# Patient Record
Sex: Female | Born: 2000 | Race: White | Hispanic: No | Marital: Single | Smoking: Never smoker
Health system: Southern US, Community
[De-identification: ages and names within clinical notes are randomized; demographics above are authoritative.]

## PROBLEM LIST (undated history)

## (undated) DIAGNOSIS — F419 Anxiety disorder, unspecified: Secondary | ICD-10-CM

## (undated) DIAGNOSIS — F329 Major depressive disorder, single episode, unspecified: Secondary | ICD-10-CM

## (undated) DIAGNOSIS — G43909 Migraine, unspecified, not intractable, without status migrainosus: Secondary | ICD-10-CM

## (undated) DIAGNOSIS — Q796 Ehlers-Danlos syndrome, unspecified: Secondary | ICD-10-CM

## (undated) DIAGNOSIS — G90A Postural orthostatic tachycardia syndrome (POTS): Secondary | ICD-10-CM

## (undated) DIAGNOSIS — N390 Urinary tract infection, site not specified: Secondary | ICD-10-CM

## (undated) DIAGNOSIS — F32A Depression, unspecified: Secondary | ICD-10-CM

## (undated) DIAGNOSIS — R569 Unspecified convulsions: Secondary | ICD-10-CM

## (undated) HISTORY — DX: Migraine, unspecified, not intractable, without status migrainosus: G43.909

## (undated) HISTORY — DX: Urinary tract infection, site not specified: N39.0

## (undated) HISTORY — DX: Unspecified convulsions: R56.9

## (undated) HISTORY — DX: Anxiety disorder, unspecified: F41.9

## (undated) HISTORY — DX: Depression, unspecified: F32.A

---
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
## 2019-09-07 ENCOUNTER — Other Ambulatory Visit: Payer: Self-pay

## 2019-09-07 DIAGNOSIS — Z20822 Contact with and (suspected) exposure to covid-19: Secondary | ICD-10-CM

## 2019-09-08 LAB — NOVEL CORONAVIRUS, NAA: SARS-CoV-2, NAA: NOT DETECTED

## 2020-01-25 ENCOUNTER — Ambulatory Visit (INDEPENDENT_AMBULATORY_CARE_PROVIDER_SITE_OTHER): Payer: Managed Care, Other (non HMO) | Admitting: Urology

## 2020-01-25 ENCOUNTER — Encounter: Payer: Self-pay | Admitting: Urology

## 2020-01-25 ENCOUNTER — Other Ambulatory Visit: Payer: Self-pay

## 2020-01-25 VITALS — BP 113/80 | HR 99 | Ht 64.0 in | Wt 141.5 lb

## 2020-01-25 DIAGNOSIS — N390 Urinary tract infection, site not specified: Secondary | ICD-10-CM

## 2020-01-25 NOTE — Progress Notes (Signed)
01/25/20 11:33 AM   Allison Torres 2001/01/14 [id number]  Referring provider: Lorraine Lax, FNP [phone] [place],  [place] [address]  CC: Recurrent UTIs  HPI: I saw Allison Torres in urology clinic today for evaluation of recurrent UTI.  She is a 19 year old female with past medical history notable for anxiety and depression and GERD/IBS who has had 3 culture documented UTIs over the last 6 months in June, August, and last week.  The only urine culture I have access to is from 08/01/2019 with greater than 100K E. coli that was pan susceptible.  She will intermittently have some back pain, worse on the right side, and she is unsure if these are associated with her episodes of UTI.  She has noted some gross hematuria during times of active infection.  Her symptoms of UTI are typically dysuria, pelvic pain, and foul-smelling urine.  She denies any history of urinary retention or nephrolithiasis.  She also had work-up with STD testing recently that was all negative.  She reports she had a normal pelvic exam in the last 3 months.  She has been sexually active with only 1 partner in the last year.  She denies any changes in her medications or health otherwise over the last 6 months.  She feels her UTI in August may have been associated with sexual activity, but does not feel like the UTI in June or December was related.  She denies any incontinence or other urinary symptoms.  She has had trouble in the past with constipation, but feels she has been more regular lately since starting a probiotic.  She denies any symptoms of UTI today.  Urinalysis today is benign with 0-5 WBCs, 0-2 RBCs, few bacteria, nitrite negative.   PMH: Past Medical History:  Diagnosis Date  . Anxiety   . Depression   . Migraine   . Seizure (HCC)   . Urinary tract infection     Surgical History: None   Allergies:  Allergies  Allergen Reactions  . Bupropion     Other reaction(s): Other  (See Comments) Seizure     Family History: No family history on file.  Social History:  reports that she has never smoked. She has never used smokeless tobacco. She reports previous alcohol use. She reports that she does not use drugs.  ROS: Please see flowsheet from today's date for complete review of systems.  Physical Exam: BP 113/80 (BP Location: Left Arm, Patient Position: Sitting, Cuff Size: Normal)   Pulse 99   Ht 5\' 4"  (1.626 m)   Wt 141 lb 8 oz (64.2 kg)   LMP 01/24/2020   BMI 24.29 kg/m    Constitutional:  Alert and oriented, No acute distress. Cardiovascular: No clubbing, cyanosis, or edema. Respiratory: Normal respiratory effort, no increased work of breathing. GI: Abdomen is soft, nontender, nondistended, no abdominal masses GU: Patient deferred Lymph: No cervical or inguinal lymphadenopathy. Skin: No rashes, bruises or suspicious lesions. Neurologic: Grossly intact, no focal deficits, moving all 4 extremities. Psychiatric: Normal mood and affect.  Laboratory Data: Reviewed, see HPI  Pertinent Imaging: None to review  Assessment & Plan:   In summary, she is a 19 year old female with 3 UTIs over the last 6 months.  We discussed the evaluation and treatment of patients with recurrent UTIs at length.  We specifically discussed the differences between asymptomatic bacteriuria and true urinary tract infection.  We discussed the AUA definition of recurrent UTI of at least  2 culture proven symptomatic acute cystitis episodes in a 62-month period, or 3 within a 1 year period.  We discussed the importance of culture directed antibiotic treatment, and antibiotic stewardship.  First-line therapy includes nitrofurantoin(5 days), Bactrim(3 days), or fosfomycin(3 g single dose).  Possible etiologies of recurrent infection include periurethral tissue atrophy in postmenopausal woman, constipation, sexual activity, incomplete emptying, anatomic abnormalities, and even genetic  predisposition.  Finally, we discussed the role of perineal hygiene, timed voiding, adequate hydration, topical vaginal estrogen, cranberry prophylaxis, and low-dose antibiotic prophylaxis.  -Cranberry tablet prophylaxis twice daily -Renal ultrasound to rule out hydronephrosis or stones in the setting of her back pain, will call with results -Virtual visit 8 weeks for symptom check, consider post-coital or low-dose trimethoprim prophylaxis if persistent UTIs  A total of 30 minutes were spent face-to-face with the patient, greater than 50% was spent in patient education, counseling, and coordination of care regarding recurrent UTIs.   Billey Co, [facility] [address], [place], [place] [address] [phone]

## 2020-01-25 NOTE — Patient Instructions (Signed)
1.  Drink plenty of fluids to keep the bladder flushed, try not to hold your urine for long times when you have to urinate 2.  Start cranberry tablets twice daily to help prevent urinary tract infections.  These can be purchased over-the-counter and do not require prescription.   Urinary Tract Infection, Adult  A urinary tract infection (UTI) is an infection of any part of the urinary tract. The urinary tract includes the kidneys, ureters, bladder, and urethra. These organs make, store, and get rid of urine in the body. Your health care provider may use other names to describe the infection. An upper UTI affects the ureters and kidneys (pyelonephritis). A lower UTI affects the bladder (cystitis) and urethra (urethritis). What are the causes? Most urinary tract infections are caused by bacteria in your genital area, around the entrance to your urinary tract (urethra). These bacteria grow and cause inflammation of your urinary tract. What increases the risk? You are more likely to develop this condition if:  You have a urinary catheter that stays in place (indwelling).  You are not able to control when you urinate or have a bowel movement (you have incontinence).  You are female and you: ? Use a spermicide or diaphragm for birth control. ? Have low estrogen levels. ? Are pregnant.  You have certain genes that increase your risk (genetics).  You are sexually active.  You take antibiotic medicines.  You have a condition that causes your flow of urine to slow down, such as: ? An enlarged prostate, if you are female. ? Blockage in your urethra (stricture). ? A kidney stone. ? A nerve condition that affects your bladder control (neurogenic bladder). ? Not getting enough to drink, or not urinating often.  You have certain medical conditions, such as: ? Diabetes. ? A weak disease-fighting system (immunesystem). ? Sickle cell disease. ? Gout. ? Spinal cord injury. What are the signs or  symptoms? Symptoms of this condition include:  Needing to urinate right away (urgently).  Frequent urination or passing small amounts of urine frequently.  Pain or burning with urination.  Blood in the urine.  Urine that smells bad or unusual.  Trouble urinating.  Cloudy urine.  Vaginal discharge, if you are female.  Pain in the abdomen or the lower back. You may also have:  Vomiting or a decreased appetite.  Confusion.  Irritability or tiredness.  A fever.  Diarrhea. The first symptom in older adults may be confusion. In some cases, they may not have any symptoms until the infection has worsened. How is this diagnosed? This condition is diagnosed based on your medical history and a physical exam. You may also have other tests, including:  Urine tests.  Blood tests.  Tests for sexually transmitted infections (STIs). If you have had more than one UTI, a cystoscopy or imaging studies may be done to determine the cause of the infections. How is this treated? Treatment for this condition includes:  Antibiotic medicine.  Over-the-counter medicines to treat discomfort.  Drinking enough water to stay hydrated. If you have frequent infections or have other conditions such as a kidney stone, you may need to see a health care provider who specializes in the urinary tract (urologist). In rare cases, urinary tract infections can cause sepsis. Sepsis is a life-threatening condition that occurs when the body responds to an infection. Sepsis is treated in the hospital with IV antibiotics, fluids, and other medicines. Follow these instructions at home:  Medicines  Take over-the-counter and prescription  medicines only as told by your health care provider.  If you were prescribed an antibiotic medicine, take it as told by your health care provider. Do not stop using the antibiotic even if you start to feel better. General instructions  Make sure you: ? Empty your bladder  often and completely. Do not hold urine for long periods of time. ? Empty your bladder after sex. ? Wipe from front to back after a bowel movement if you are female. Use each tissue one time when you wipe.  Drink enough fluid to keep your urine pale yellow.  Keep all follow-up visits as told by your health care provider. This is important. Contact a health care provider if:  Your symptoms do not get better after 1-2 days.  Your symptoms go away and then return. Get help right away if you have:  Severe pain in your back or your lower abdomen.  A fever.  Nausea or vomiting. Summary  A urinary tract infection (UTI) is an infection of any part of the urinary tract, which includes the kidneys, ureters, bladder, and urethra.  Most urinary tract infections are caused by bacteria in your genital area, around the entrance to your urinary tract (urethra).  Treatment for this condition often includes antibiotic medicines.  If you were prescribed an antibiotic medicine, take it as told by your health care provider. Do not stop using the antibiotic even if you start to feel better.  Keep all follow-up visits as told by your health care provider. This is important. This information is not intended to replace advice given to you by your health care provider. Make sure you discuss any questions you have with your health care provider. Document Revised: 11/24/2018 Document Reviewed: 06/16/2018 Elsevier Patient Education  2020 [employer].

## 2020-01-26 LAB — URINALYSIS, COMPLETE
Bilirubin, UA: NEGATIVE
Glucose, UA: NEGATIVE
Leukocytes,UA: NEGATIVE
Nitrite, UA: NEGATIVE
Specific Gravity, UA: 1.025 (ref 1.005–1.030)
Urobilinogen, Ur: 2 mg/dL — ABNORMAL HIGH (ref 0.2–1.0)
pH, UA: 5.5 (ref 5.0–7.5)

## 2020-01-26 LAB — MICROSCOPIC EXAMINATION

## 2020-02-09 ENCOUNTER — Ambulatory Visit
Admission: RE | Admit: 2020-02-09 | Discharge: 2020-02-09 | Disposition: A | Discharge status: Home / Self Care | Payer: Managed Care, Other (non HMO) | Source: Ambulatory Visit | Attending: Urology | Admitting: Urology

## 2020-02-09 ENCOUNTER — Other Ambulatory Visit: Payer: Self-pay

## 2020-02-09 DIAGNOSIS — N390 Urinary tract infection, site not specified: Secondary | ICD-10-CM | POA: Diagnosis present

## 2020-02-12 ENCOUNTER — Telehealth: Payer: Self-pay

## 2020-02-12 NOTE — Telephone Encounter (Signed)
Called pt no answer unable to leave message as voicemail is full. Mychart message sent.

## 2020-02-12 NOTE — Telephone Encounter (Signed)
-----   Message from Sondra Come, MD sent at 02/12/2020  8:16 AM EST ----- Normal renal US, no stones or hydro, keep follow up as scheduled  Legrand Rams, MD 02/12/2020

## 2020-02-22 ENCOUNTER — Ambulatory Visit: Payer: 59 | Attending: Internal Medicine

## 2020-02-22 DIAGNOSIS — Z20822 Contact with and (suspected) exposure to covid-19: Secondary | ICD-10-CM

## 2020-02-23 LAB — NOVEL CORONAVIRUS, NAA: SARS-CoV-2, NAA: NOT DETECTED

## 2020-03-28 ENCOUNTER — Telehealth: Payer: Self-pay | Admitting: Urology

## 2020-04-09 ENCOUNTER — Telehealth: Payer: 59 | Admitting: Urology

## 2020-04-09 ENCOUNTER — Other Ambulatory Visit: Payer: Self-pay

## 2020-06-30 ENCOUNTER — Telehealth: Payer: 59 | Admitting: Family

## 2020-06-30 DIAGNOSIS — J029 Acute pharyngitis, unspecified: Secondary | ICD-10-CM

## 2020-06-30 NOTE — Progress Notes (Signed)
Based on what you shared with me, I feel your condition warrants further evaluation and I recommend that you be seen for a face to face office visit.  Given that a sore throat is her recurrent may be seen face-to-face   NOTE: If you entered your credit card information for this eVisit, you will not be charged. You may see a "hold" on your card for the $35 but that hold will drop off and you will not have a charge processed.   If you are having a true medical emergency please call 911.      For an urgent face to face visit, [place] has five urgent care centers for your convenience:      NEW:  [facility] at [facility] Directions [phone] [address] [place], [place] [address] . 10 am - 6pm Monday - Friday    [facility]) Get Driving Directions [phone] [address] [place], [place] [address] . 10 am to 8 pm Monday-Friday . 12 pm to 8 pm [facility] Urgent Care at [facility] Get Driving Directions [phone] [address] [place], [place] [address] . 8 am to 8 pm Monday-Friday . 9 am to 6 pm Saturday . 11 am to 6 pm Sunday     [facility] at [facility] Get Driving Directions  [phone] [address] [place], [place] [address] . 8 am to 8 pm Monday-Friday . 8 am to 4 pm [facility] Urgent Care at [facility] [address] Directions [phone] [address] [place], [place] [address] . 12 pm to 6 pm Monday-Friday      Your e-visit answers were reviewed by a board certified advanced clinical practitioner to complete your personal care plan.  Thank you for using e-Visits.

## 2020-07-01 ENCOUNTER — Ambulatory Visit
Admission: EM | Admit: 2020-07-01 | Discharge: 2020-07-01 | Disposition: A | Discharge status: Home / Self Care | Payer: 59 | Attending: Family Medicine | Admitting: Family Medicine

## 2020-07-01 DIAGNOSIS — J040 Acute laryngitis: Secondary | ICD-10-CM

## 2020-07-01 DIAGNOSIS — B49 Unspecified mycosis: Secondary | ICD-10-CM | POA: Diagnosis not present

## 2020-07-01 LAB — POCT RAPID STREP A (OFFICE): Rapid Strep A Screen: NEGATIVE

## 2020-07-01 MED ORDER — FLUCONAZOLE 200 MG PO TABS: 200.0000 mg | ORAL_TABLET | Freq: Every day | ORAL | 0 refills | Status: AC

## 2020-07-01 NOTE — ED Provider Notes (Addendum)
[name]    CSN: [id number] Arrival date & time: 07/01/20  1415      History   Chief Complaint Chief Complaint  Patient presents with  . Sore Throat  . Cough    HPI Allison Torres is a 19 y.o. female.   HPI Patient with a history of candidal pharyngitis presents today for evaluation of sore throat and loss of voice.  She reports a similar course of symptoms occurred last year and she was found to have a candidal infection.  She endorses low-grade fever and has a low-grade temperature today of 99.6.  She has no other associated symptoms. Limited history given patient is having difficulty speaking due to loss of voice. Past Medical History:  Diagnosis Date  . Anxiety   . Depression   . Migraine   . Seizure (HCC)   . Urinary tract infection     Patient Active Problem List   Diagnosis Date Noted  . Seizure (HCC) 12/12/2018  . Sinus tachycardia 12/12/2018  . Depression with anxiety 12/06/2018  . 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 Diplopia 02/08/2018  . Abdominal pain of multiple sites 09/17/2017  . Encounter for Hemoccult screening 09/17/2017  . Gastroesophageal reflux disease without esophagitis 07/06/2017  . 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 History of panic attacks 10/15/2015  . 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 Migraines 10/31/2013    History reviewed. No pertinent surgical history.  OB History   No obstetric history on file.      Home Medications    Prior to Admission medications   Medication Sig Start Date End Date Taking? Authorizing Provider  DULoxetine (CYMBALTA) 60 MG capsule TAKE 2 CAPSULES BY MOUTH DAILY   NEEDS TELEMED APPOINTMENT FOR FURTHER REFILLS 08/17/19   [provider]  JUNEL FE 1/20 1-20 MG-MCG tablet TAKE 1 TABLET BY MOUTH ONCE DAILY FOR 30 DAYS PATIENT NEEDS APPOINTMENT FOR REFILLS 09/02/19   [provider]  pantoprazole (PROTONIX) 40 MG  tablet Take by mouth. 01/19/20   [provider]    Family History History reviewed. No pertinent family history.  Social History Social History   Tobacco Use  . Smoking status: Never Smoker  . Smokeless tobacco: Never Used  Vaping Use  . Vaping Use: Never used  Substance Use Topics  . Alcohol use: Not Currently  . Drug use: Never     Allergies   Bupropion   Review of Systems Review of Systems Pertinent negatives listed in HPI Physical Exam Triage Vital Signs ED Triage Vitals  Enc Vitals Group     BP 07/01/20 1419 115/77     Pulse Rate 07/01/20 1419 97     Resp 07/01/20 1419 16     Temp 07/01/20 1419 99.6 F (37.6 C)     Temp src --      SpO2 07/01/20 1419 96 %     Weight --      Height --      Head Circumference --      Peak Flow --      Pain Score 07/01/20 1417 4     Pain Loc --      Pain Edu? --      Excl. in GC? --    No data found.  Updated Vital Signs BP 115/77   Pulse 97   Temp 99.6 F (37.6 C)   Resp 16   LMP 06/17/2020 (Within Days)   SpO2 96%   Visual  Acuity Right Eye Distance:   Left Eye Distance:   Bilateral Distance:    Right Eye Near:   Left Eye Near:    Bilateral Near:        Physical Exam Constitutional:      Appearance: She is ill-appearing.  HENT:     Head: Normocephalic.     Mouth/Throat:     Mouth: Mucous membranes are moist.     Dentition: Abnormal dentition.     Pharynx: Pharyngeal swelling and uvula swelling present.     Comments: Discomfort with swallowing. Hoarseness of voice. Cardiovascular:     Rate and Rhythm: Normal rate.     Heart sounds: Normal heart sounds.  Pulmonary:     Effort: Pulmonary effort is normal.     Breath sounds: Normal breath sounds.         UC Treatments / Results  Labs (all labs ordered are listed, but only abnormal results are displayed) Labs Reviewed  POCT RAPID STREP A (OFFICE)    EKG   Radiology No results found.  Procedures Procedures (including  critical care time)  Medications Ordered in UC Medications - No data to display  Initial Impression / Assessment and Plan / UC Course  I have reviewed the triage vital signs and the nursing notes.  Pertinent labs & imaging results that were available during my care of the patient were reviewed by me and considered in my medical decision making (see chart for details).    Patient with a history of fungal throat infection presents today for sore throat. Rapid strep negative. Patient requested a diflucan as this resolved last throat infection last year. Patient doesn't wish to have COVID testing Red flags discussed. An After Visit Summary was printed and given to the patient.  Final Clinical Impressions(s) / UC Diagnoses   Final diagnoses:  Fungal infection  Laryngitis     Discharge Instructions     Recommend trialing Diflucan 1 tablet daily for 3 days.  Continue warm tea and salt water gargles.  Follow-up with your primary care provider regarding a referral to throat specialist.     Bing Neighbors, FNP 07/03/20 2250    Bing Neighbors, FNP 07/03/20 2255

## 2020-07-01 NOTE — ED Triage Notes (Signed)
C/o dry cough and sore throat. Reports she experienced this once before when she had laryngitis. States it feels just like that.   Denies: fever, congestion, n/v/d  OTC: warm beverages

## 2020-07-01 NOTE — Discharge Instructions (Signed)
Recommend trialing Diflucan 1 tablet daily for 3 days.  Continue warm tea and salt water gargles.  Follow-up with your primary care provider regarding a referral to throat specialist.

## 2020-10-01 ENCOUNTER — Ambulatory Visit
Admission: EM | Admit: 2020-10-01 | Discharge: 2020-10-01 | Disposition: A | Discharge status: Home / Self Care | Payer: 59 | Attending: Family Medicine | Admitting: Family Medicine

## 2020-10-01 DIAGNOSIS — R300 Dysuria: Secondary | ICD-10-CM | POA: Insufficient documentation

## 2020-10-01 DIAGNOSIS — R109 Unspecified abdominal pain: Secondary | ICD-10-CM | POA: Diagnosis not present

## 2020-10-01 DIAGNOSIS — R350 Frequency of micturition: Secondary | ICD-10-CM | POA: Insufficient documentation

## 2020-10-01 DIAGNOSIS — N3000 Acute cystitis without hematuria: Secondary | ICD-10-CM | POA: Diagnosis not present

## 2020-10-01 DIAGNOSIS — R3915 Urgency of urination: Secondary | ICD-10-CM | POA: Insufficient documentation

## 2020-10-01 LAB — POCT URINALYSIS DIP (MANUAL ENTRY)
Bilirubin, UA: NEGATIVE
Glucose, UA: NEGATIVE mg/dL
Ketones, POC UA: NEGATIVE mg/dL
Nitrite, UA: POSITIVE — AB
Protein Ur, POC: NEGATIVE mg/dL
Spec Grav, UA: 1.025 (ref 1.010–1.025)
Urobilinogen, UA: 1 E.U./dL
pH, UA: 6 (ref 5.0–8.0)

## 2020-10-01 LAB — POCT URINE PREGNANCY: Preg Test, Ur: NEGATIVE

## 2020-10-01 MED ORDER — NITROFURANTOIN MONOHYD MACRO 100 MG PO CAPS: 100.0000 mg | ORAL_CAPSULE | Freq: Two times a day (BID) | ORAL | 0 refills | Status: DC

## 2020-10-01 NOTE — Discharge Instructions (Addendum)
You do have a UTI today.  We will culture your urine and make sure that the medication that I have sent to matches the germ that is causing your issues.  I have sent in Macrobid for you to take twice a day for 5 days.  You may take Azo over-the-counter to help with urinary discomfort  If you have another UTI this year, I would recommend starting a low-dose prophylactic antibiotic to help keep UTIs at bay.  Follow-up with this office or with primary care as needed  Follow-up the ER for high fever, trouble swallowing, trouble breathing, other concerning symptom

## 2020-10-01 NOTE — ED Triage Notes (Signed)
Pt reports bladder pain and dysuria since this morning.  Also has R flank pain since this morning. Has had three UTIs already this year.  Is seeing urologist.   Has had unprotected sex but denies vaginal discharge, rash.  Had STD testing approx 6 mos ago and only one partner since then.

## 2020-10-01 NOTE — ED Provider Notes (Signed)
[facility]   CC: UTI  SUBJECTIVE:  Allison Torres is a 19 y.o. female who complains of urinary frequency, urgency and dysuria for the past 3 days. Also reports that she has already had 4 or 5 UTIs this year. Reports that she saw urology earlier this year for the summer. Reports that she does have sex, but that she voids before and afterward. Denies any vaginal discharge, rash, anything abnormal.  Reports right flank pain since this morning.  Has not attempted OTC treatment.  Reports that she has had STD treatment bowel 6 months ago and has only had one partner since then.  Declines testing today.  Symptoms are made worse with urination. Admits to similar symptoms in the past.  Denies fever, chills, nausea, vomiting, abdominal pain, abnormal vaginal discharge or bleeding, hematuria.    LMP: Patient's last menstrual period was 09/01/2020 (approximate).  ROS: As in HPI.  All other pertinent ROS negative.     Past Medical History:  Diagnosis Date  . Anxiety   . Depression   . Migraine   . Seizure (HCC)    reaction to wellbutrin   . Urinary tract infection    History reviewed. No pertinent surgical history. Allergies  Allergen Reactions  . Bupropion     Other reaction(s): Other (See Comments) Seizure    No current facility-administered medications on file prior to encounter.   Current Outpatient Medications on File Prior to Encounter  Medication Sig Dispense Refill  . DULoxetine (CYMBALTA) 60 MG capsule TAKE 2 CAPSULES BY MOUTH DAILY   NEEDS TELEMED APPOINTMENT FOR FURTHER REFILLS    . esomeprazole (NEXIUM) 20 MG capsule Take 20 mg by mouth daily at 12 noon.    . JUNEL FE 1/20 1-20 MG-MCG tablet TAKE 1 TABLET BY MOUTH ONCE DAILY FOR 30 DAYS PATIENT NEEDS APPOINTMENT FOR REFILLS    . pantoprazole (PROTONIX) 40 MG tablet Take by mouth.     Social History   Socioeconomic History  . Marital status: Single    Spouse name: Not on file  . Number of children: Not on file   . Years of education: Not on file  . Highest education level: Not on file  Occupational History  . Not on file  Tobacco Use  . Smoking status: Never Smoker  . Smokeless tobacco: Never Used  Vaping Use  . Vaping Use: Never used  Substance and Sexual Activity  . Alcohol use: Not Currently  . Drug use: Never  . Sexual activity: Yes    Birth control/protection: Pill  Other Topics Concern  . Not on file  Social History Narrative  . Not on file   Social Determinants of Health   Financial Resource Strain:   . Difficulty of Paying Living Expenses: Not on file  Food Insecurity:   . Worried About Programme researcher, broadcasting/film/video in the Last Year: Not on file  . Ran Out of Food in the Last Year: Not on file  Transportation Needs:   . Lack of Transportation (Medical): Not on file  . Lack of Transportation (Non-Medical): Not on file  Physical Activity:   . Days of Exercise per Week: Not on file  . Minutes of Exercise per Session: Not on file  Stress:   . Feeling of Stress : Not on file  Social Connections:   . Frequency of Communication with Friends and Family: Not on file  . Frequency of Social Gatherings with Friends and Family: Not on file  . Attends Religious Services:  Not on file  . Active Member of Clubs or Organizations: Not on file  . Attends Banker Meetings: Not on file  . Marital Status: Not on file  Intimate Partner Violence:   . Fear of Current or Ex-Partner: Not on file  . Emotionally Abused: Not on file  . Physically Abused: Not on file  . Sexually Abused: Not on file   Family History  Problem Relation Age of Onset  . Healthy Father   . Healthy Mother     OBJECTIVE:  Vitals:   10/01/20 1344  BP: 117/77  Pulse: 100  Resp: 16  Temp: 99.5 F (37.5 C)  TempSrc: Oral  SpO2: 97%   General appearance: AOx3 in no acute distress HEENT: NCAT. Oropharynx clear.  Lungs: clear to auscultation bilaterally without adventitious breath sounds Heart: regular rate  and rhythm. Radial pulses 2+ symmetrical bilaterally Abdomen: soft; non-distended; suprapubic tenderness; bowel sounds present; no guarding or rebound tenderness Back: no CVA tenderness Extremities: no edema; symmetrical with no gross deformities Skin: warm and dry Neurologic: Ambulates from chair to exam table without difficulty Psychological: alert and cooperative; normal mood and affect  Labs Reviewed  URINE CULTURE  URINALYSIS, ROUTINE W REFLEX MICROSCOPIC  POCT URINE PREGNANCY    ASSESSMENT & PLAN:  1. Acute cystitis without hematuria     Meds ordered this encounter  Medications  . nitrofurantoin, macrocrystal-monohydrate, (MACROBID) 100 MG capsule    Sig: Take 1 capsule (100 mg total) by mouth 2 (two) times daily.    Dispense:  10 capsule    Refill:  0    Order Specific Question:   Supervising Provider    Answer:   Merrilee Jansky [id number]    Urine culture sent  We will call you with abnormal results that need further treatment Push fluids and get plenty of rest Prescribed Macrobid Take antibiotic as directed and to completion Take AZO as needed for symptomatic relief Follow up with PCP if symptoms persists Return here or go to ER if you have any new or worsening symptoms such as fever, worsening abdominal pain, nausea/vomiting, flank pain  Outlined signs and symptoms indicating need for more acute intervention Patient verbalized understanding After Visit Summary given     Moshe Cipro, NP 10/01/20 1400

## 2020-10-02 LAB — URINE CULTURE: Special Requests: NORMAL

## 2020-10-03 LAB — URINE CULTURE: Culture: >=100000 — AB

## 2020-11-18 ENCOUNTER — Encounter: Payer: Self-pay | Admitting: Emergency Medicine

## 2020-11-18 ENCOUNTER — Ambulatory Visit
Admission: EM | Admit: 2020-11-18 | Discharge: 2020-11-18 | Disposition: A | Discharge status: Home / Self Care | Payer: 59 | Attending: Emergency Medicine | Admitting: Emergency Medicine

## 2020-11-18 ENCOUNTER — Other Ambulatory Visit: Payer: Self-pay

## 2020-11-18 DIAGNOSIS — J069 Acute upper respiratory infection, unspecified: Secondary | ICD-10-CM | POA: Insufficient documentation

## 2020-11-18 DIAGNOSIS — H6693 Otitis media, unspecified, bilateral: Secondary | ICD-10-CM | POA: Diagnosis not present

## 2020-11-18 LAB — POCT RAPID STREP A (OFFICE): Rapid Strep A Screen: NEGATIVE

## 2020-11-18 MED ORDER — AMOXICILLIN 875 MG PO TABS: 875.0000 mg | ORAL_TABLET | Freq: Two times a day (BID) | ORAL | 0 refills | Status: AC

## 2020-11-18 NOTE — Discharge Instructions (Addendum)
Take the antibiotic as directed for your ear infection.   Your rapid strep test is negative.  A throat culture is pending; we will call you if it is positive requiring treatment.    Your COVID and Flu tests are pending.  You should self quarantine until the test results are back.    Take Tylenol or ibuprofen as needed for fever or discomfort.  Rest and keep yourself hydrated.    Follow-up with your primary care provider if your symptoms are not improving.

## 2020-11-18 NOTE — ED Triage Notes (Signed)
Patient c/o sore throat , ear pain, and "lymph node swelling" x 2 days.   Patient endorses fever (temp of 100 F per patient statement).  Patient endorses headache. Patient endorses non-productive cough.   Patient has taken Mucinex, Sudafed, and Tylenol w/ no relief of symptoms.

## 2020-11-18 NOTE — ED Provider Notes (Signed)
Allison Torres    CSN: [id number] Arrival date & time: 11/18/20  1326      History   Chief Complaint Chief Complaint  Patient presents with  . Sore Throat    HPI Allison Torres is a 19 y.o. female.   Patient presents with 2-day history of fever, sore throat, headache, bilateral ear pain, nonproductive cough.  T-max 100.  She denies rash, shortness of breath, vomiting, diarrhea, or other symptoms.  OTC treatment attempted at home.  Her medical history includes seizures, migraine, depression, anxiety, panic attacks.  The history is provided by the patient.    Past Medical History:  Diagnosis Date  . Anxiety   . Depression   . Migraine   . Seizure (HCC)    reaction to wellbutrin   . Urinary tract infection     Patient Active Problem List   Diagnosis Date Noted  . Seizure (HCC) 12/12/2018  . Sinus tachycardia 12/12/2018  . Depression with anxiety 12/06/2018  . 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 Diplopia 02/08/2018  . Abdominal pain of multiple sites 09/17/2017  . Encounter for Hemoccult screening 09/17/2017  . Gastroesophageal reflux disease without esophagitis 07/06/2017  . 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 History of panic attacks 10/15/2015  . 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 Migraines 10/31/2013    History reviewed. No pertinent surgical history.  OB History   No obstetric history on file.      Home Medications    Prior to Admission medications   Medication Sig Start Date End Date Taking? Authorizing Provider  DULoxetine (CYMBALTA) 60 MG capsule TAKE 2 CAPSULES BY MOUTH DAILY   NEEDS TELEMED APPOINTMENT FOR FURTHER REFILLS 08/17/19  Yes [provider]  esomeprazole (NEXIUM) 20 MG capsule Take 20 mg by mouth daily at 12 noon.   Yes [provider]  JUNEL FE 1/20 1-20 MG-MCG tablet TAKE 1 TABLET BY MOUTH ONCE DAILY FOR 30 DAYS PATIENT NEEDS APPOINTMENT FOR REFILLS  09/02/19  Yes [provider]  amoxicillin (AMOXIL) 875 MG tablet Take 1 tablet (875 mg total) by mouth 2 (two) times daily for 7 days. 11/18/20 11/25/20  Mickie Bail, NP  nitrofurantoin, macrocrystal-monohydrate, (MACROBID) 100 MG capsule Take 1 capsule (100 mg total) by mouth 2 (two) times daily. 10/01/20   Moshe Cipro, NP  pantoprazole (PROTONIX) 40 MG tablet Take by mouth. 01/19/20   [provider]    Family History Family History  Problem Relation Age of Onset  . Healthy Father   . Healthy Mother     Social History Social History   Tobacco Use  . Smoking status: Never Smoker  . Smokeless tobacco: Never Used  Vaping Use  . Vaping Use: Never used  Substance Use Topics  . Alcohol use: Not Currently  . Drug use: Never     Allergies   Bupropion and Trintellix [vortioxetine]   Review of Systems Review of Systems  Constitutional: Positive for fever. Negative for chills.  HENT: Positive for congestion, ear pain, rhinorrhea, sinus pressure and sore throat.   Eyes: Negative for pain and visual disturbance.  Respiratory: Positive for cough. Negative for shortness of breath.   Cardiovascular: Negative for chest pain and palpitations.  Gastrointestinal: Negative for abdominal pain and vomiting.  Genitourinary: Negative for dysuria and hematuria.  Musculoskeletal: Negative for arthralgias and back pain.  Skin: Negative for color change and rash.  Neurological: Negative for seizures and syncope.  All other systems reviewed and  are negative.    Physical Exam Triage Vital Signs ED Triage Vitals  Enc Vitals Group     BP 11/18/20 1436 118/79     Pulse Rate 11/18/20 1436 (!) 114     Resp 11/18/20 1436 14     Temp 11/18/20 1436 99.5 F (37.5 C)     Temp Source 11/18/20 1436 Oral     SpO2 11/18/20 1436 95 %     Weight --      Height --      Head Circumference --      Peak Flow --      Pain Score 11/18/20 1433 3     Pain Loc --      Pain Edu? --       Excl. in GC? --    No data found.  Updated Vital Signs BP 118/79 (BP Location: Right Arm)   Pulse (!) 114   Temp 99.5 F (37.5 C) (Oral)   Resp 14   LMP 10/28/2020   SpO2 95%   Visual Acuity Right Eye Distance:   Left Eye Distance:   Bilateral Distance:    Right Eye Near:   Left Eye Near:    Bilateral Near:     Physical Exam Vitals and nursing note reviewed.  Constitutional:      General: She is not in acute distress.    Appearance: She is well-developed.  HENT:     Head: Normocephalic and atraumatic.     Right Ear: Tympanic membrane is erythematous.     Left Ear: Tympanic membrane is erythematous.     Nose: Congestion and rhinorrhea present.     Mouth/Throat:     Mouth: Mucous membranes are moist.     Pharynx: Posterior oropharyngeal erythema present.  Eyes:     Conjunctiva/sclera: Conjunctivae normal.  Cardiovascular:     Rate and Rhythm: Regular rhythm. Tachycardia present.     Heart sounds: Normal heart sounds.  Pulmonary:     Effort: Pulmonary effort is normal. No respiratory distress.     Breath sounds: Normal breath sounds.  Abdominal:     Palpations: Abdomen is soft.     Tenderness: There is no abdominal tenderness. There is no guarding or rebound.  Musculoskeletal:     Cervical back: Neck supple.  Skin:    General: Skin is warm and dry.     Findings: No rash.  Neurological:     General: No focal deficit present.     Mental Status: She is alert and oriented to person, place, and time.     Gait: Gait normal.  Psychiatric:        Mood and Affect: Mood normal.        Behavior: Behavior normal.      UC Treatments / Results  Labs (all labs ordered are listed, but only abnormal results are displayed) Labs Reviewed  CULTURE, GROUP A STREP ([facility])  COVID-19, FLU A+B AND RSV  POCT RAPID STREP A (OFFICE)    EKG   Radiology No results found.  Procedures Procedures (including critical care time)  Medications Ordered in UC Medications -  No data to display  Initial Impression / Assessment and Plan / UC Course  I have reviewed the triage vital signs and the nursing notes.  Pertinent labs & imaging results that were available during my care of the patient were reviewed by me and considered in my medical decision making (see chart for details).   Bilateral otitis media, Viral URI.  Treating OM with amoxicillin.  Rapid strep negative; culture pending.  Influenza, RSV, COVID pending.  Instructed patient to self quarantine until the test results are back.  Discussed symptomatic treatment including Tylenol, rest, hydration.  Instructed patient to follow up with PCP if her symptoms are not improving  Patient agrees to plan of care.    Final Clinical Impressions(s) / UC Diagnoses   Final diagnoses:  Bilateral otitis media, unspecified otitis media type  Viral URI     Discharge Instructions     Take the antibiotic as directed for your ear infection.   Your rapid strep test is negative.  A throat culture is pending; we will call you if it is positive requiring treatment.    Your COVID and Flu tests are pending.  You should self quarantine until the test results are back.    Take Tylenol or ibuprofen as needed for fever or discomfort.  Rest and keep yourself hydrated.    Follow-up with your primary care provider if your symptoms are not improving.        ED Prescriptions    Medication Sig Dispense Auth. Provider   amoxicillin (AMOXIL) 875 MG tablet Take 1 tablet (875 mg total) by mouth 2 (two) times daily for 7 days. 14 tablet Mickie Bail, NP     PDMP not reviewed this encounter.   Mickie Bail, NP 11/18/20 [phone]

## 2020-11-19 LAB — COVID-19, FLU A+B AND RSV
Influenza A, NAA: NOT DETECTED
Influenza B, NAA: NOT DETECTED
RSV, NAA: NOT DETECTED
SARS-CoV-2, NAA: NOT DETECTED

## 2020-11-21 LAB — CULTURE, GROUP A STREP (THRC)

## 2021-06-03 ENCOUNTER — Ambulatory Visit
Admission: EM | Admit: 2021-06-03 | Discharge: 2021-06-03 | Disposition: A | Discharge status: Home / Self Care | Payer: 59 | Attending: Emergency Medicine | Admitting: Emergency Medicine

## 2021-06-03 ENCOUNTER — Other Ambulatory Visit: Payer: Self-pay

## 2021-06-03 ENCOUNTER — Encounter: Payer: Self-pay | Admitting: Emergency Medicine

## 2021-06-03 DIAGNOSIS — M5441 Lumbago with sciatica, right side: Secondary | ICD-10-CM

## 2021-06-03 DIAGNOSIS — R1030 Lower abdominal pain, unspecified: Secondary | ICD-10-CM

## 2021-06-03 DIAGNOSIS — M5442 Lumbago with sciatica, left side: Secondary | ICD-10-CM

## 2021-06-03 HISTORY — DX: Ehlers-Danlos syndrome, unspecified: Q79.60

## 2021-06-03 HISTORY — DX: Postural orthostatic tachycardia syndrome (POTS): G90.A

## 2021-06-03 LAB — POCT URINALYSIS DIP (MANUAL ENTRY)
Bilirubin, UA: NEGATIVE
Glucose, UA: NEGATIVE mg/dL
Ketones, POC UA: NEGATIVE mg/dL
Leukocytes, UA: NEGATIVE
Nitrite, UA: NEGATIVE
Protein Ur, POC: NEGATIVE mg/dL
Spec Grav, UA: 1.02 (ref 1.010–1.025)
Urobilinogen, UA: 0.2 E.U./dL
pH, UA: 8 (ref 5.0–8.0)

## 2021-06-03 LAB — POCT URINE PREGNANCY: Preg Test, Ur: NEGATIVE

## 2021-06-03 NOTE — ED Provider Notes (Signed)
Allison Torres    CSN: [id number] Arrival date & time: 06/03/21  1336      History   Chief Complaint Chief Complaint  Patient presents with   Abdominal Pain   Vaginal Bleeding     HPI Allison Torres is a 20 y.o. female.  Patient presents with 2-day history of bleeding with her menstrual cycle.  She also reports bilateral low back pain and lower abdominal cramping.  Patient states her low back pain radiates to both sides of her lower buttocks.  Falls or injury.  No treatments attempted at home.  She denies fever, chills, vomiting, diarrhea, dysuria, vaginal discharge, pelvic pain, numbness, weakness, saddle anesthesia, loss of control of bowel/bladder, or other symptoms.  Last bowel movement yesterday.  Her medical history includes GERD, postural orthostatic tachycardia syndrome POTS, mild intermittent asthma, COVID-19 in April 2022, depression, anxiety, panic attacks, migraine headaches, seizures.  The history is provided by the patient and medical records.   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 No pertinent surgical history.  OB History   No obstetric history on file.      Home Medications    Prior  to Admission medications   Medication Sig Start Date End Date Taking? Authorizing Provider  JUNEL FE 1/20 1-20 MG-MCG tablet TAKE 1 TABLET BY MOUTH ONCE DAILY FOR 30 DAYS PATIENT NEEDS APPOINTMENT FOR REFILLS 09/02/19  Yes [provider]  omeprazole (PRILOSEC) 40 MG capsule Take by mouth. 11/22/20 11/22/21 Yes [provider]  DULoxetine (CYMBALTA) 60 MG capsule TAKE 2 CAPSULES BY MOUTH DAILY   NEEDS TELEMED APPOINTMENT FOR FURTHER REFILLS 08/17/19   [provider]  esomeprazole (NEXIUM) 20 MG capsule Take 20 mg by mouth daily at 12 noon.    [provider]  nitrofurantoin, macrocrystal-monohydrate, (MACROBID) 100 MG capsule Take 1 capsule (100 mg total) by mouth 2 (two) times daily. 10/01/20   Moshe Cipro, NP  pantoprazole (PROTONIX) 40 MG tablet Take by mouth. 01/19/20   [provider]  propranolol (INDERAL) 10 MG tablet Take 10 mg by mouth 2 (two) times daily. 05/27/21   [provider]    Family History Family History  Problem Relation Age of Onset   Healthy Father    Healthy Mother     Social History Social History   Tobacco Use   Smoking status: Never   Smokeless tobacco: Never  Vaping Use   Vaping Use: Never used  Substance Use Topics   Alcohol use: Not Currently   Drug use: Never     Allergies   Bupropion and Trintellix [vortioxetine]   Review of Systems Review of Systems  Constitutional:  Negative for chills and fever.  Respiratory:  Negative for  cough and shortness of breath.   Cardiovascular:  Negative for chest pain and palpitations.  Gastrointestinal:  Positive for abdominal pain. Negative for constipation, diarrhea and vomiting.  Genitourinary:  Positive for vaginal bleeding. Negative for dysuria, flank pain, hematuria, pelvic pain and vaginal discharge.  Musculoskeletal:  Positive for back pain. Negative for gait problem.  Skin:  Negative for color change and rash.  All other systems reviewed and are  negative.   Physical Exam Triage Vital Signs ED Triage Vitals  Enc Vitals Group     BP      Pulse      Resp      Temp      Temp src      SpO2      Weight      Height      Head Circumference      Peak Flow      Pain Score      Pain Loc      Pain Edu?      Excl. in GC?    No data found.  Updated Vital Signs BP 103/70 (BP Location: Left Arm)   Pulse 78   Temp 98.9 F (37.2 C) (Oral)   Resp 18   LMP 06/01/2021 (Exact Date)   SpO2 97%   Visual Acuity Right Eye Distance:   Left Eye Distance:   Bilateral Distance:    Right Eye Near:   Left Eye Near:    Bilateral Near:     Physical Exam Vitals and nursing note reviewed.  Constitutional:      General: She is not in acute distress.    Appearance: She is well-developed. She is not ill-appearing.  HENT:     Head: Normocephalic and atraumatic.     Mouth/Throat:     Mouth: Mucous membranes are moist.  Eyes:     Conjunctiva/sclera: Conjunctivae normal.  Cardiovascular:     Rate and Rhythm: Normal rate and regular rhythm.     Heart sounds: Normal heart sounds.  Pulmonary:     Effort: Pulmonary effort is normal. No respiratory distress.     Breath sounds: Normal breath sounds.  Abdominal:     General: Bowel sounds are normal.     Palpations: Abdomen is soft.     Tenderness: There is no abdominal tenderness. There is no right CVA tenderness, left CVA tenderness, guarding or rebound.  Musculoskeletal:        General: No swelling, tenderness, deformity or signs of injury. Normal range of motion.     Cervical back: Neck supple.  Skin:    General: Skin is warm and dry.     Findings: No bruising, erythema, lesion or rash.  Neurological:     General: No focal deficit present.     Mental Status: She is alert and oriented to person, place, and time.     Gait: Gait normal.  Psychiatric:        Mood and Affect: Affect is tearful.     Comments: Patient tearful at end of exam due to low back pain.      UC Treatments /  Results  Labs (all labs ordered are listed, but only abnormal results are displayed) Labs Reviewed  POCT URINALYSIS DIP (MANUAL ENTRY) - Abnormal; Notable for the following components:      Result Value   Blood, UA moderate (*)    All other components within normal limits  POCT URINE PREGNANCY    EKG   Radiology No results  found.  Procedures Procedures (including critical care time)  Medications Ordered in UC Medications - No data to display  Initial Impression / Assessment and Plan / UC Course  I have reviewed the triage vital signs and the nursing notes.  Pertinent labs & imaging results that were available during my care of the patient were reviewed by me and considered in my medical decision making (see chart for details).  Lower abdominal pain, acute low back pain with bilateral sciatica.  Patient declines transfer to the ED.  Urine does not show signs of infection.  Urine pregnancy negative.  She states she is unable to take NSAIDs due to history of abdominal issues.  Discussed Tylenol as needed for discomfort.  Strict ED precautions discussed if she has ongoing or worsening symptoms, including abdominal pain or back pain.  Directed her to follow-up with her PCP.  Patient agrees to plan of care.   Final Clinical Impressions(s) / UC Diagnoses   Final diagnoses:  Lower abdominal pain  Acute bilateral low back pain with bilateral sciatica     Discharge Instructions      Go to the emergency department if you have acute worsening symptoms, including abdominal or back pain.    Take Tylenol as needed for discomfort.         ED Prescriptions   None    PDMP not reviewed this encounter.   Mickie Bail, NP 06/03/21 1416

## 2021-06-03 NOTE — Discharge Instructions (Addendum)
Go to the emergency department if you have acute worsening symptoms, including abdominal or back pain.    Take Tylenol as needed for discomfort.

## 2021-06-03 NOTE — ED Triage Notes (Signed)
Pt presents today with c/o "I think I'm having a really bad menstrual cycle". She reports starting her cycle yesterday. She is on birth control pills. Denies taking Tylenol or Ibuprofen pta.

## 2021-08-25 ENCOUNTER — Ambulatory Visit
Admission: EM | Admit: 2021-08-25 | Discharge: 2021-08-25 | Disposition: A | Discharge status: Home / Self Care | Payer: 59 | Attending: Emergency Medicine | Admitting: Emergency Medicine

## 2021-08-25 ENCOUNTER — Encounter: Payer: Self-pay | Admitting: Emergency Medicine

## 2021-08-25 ENCOUNTER — Other Ambulatory Visit: Payer: Self-pay

## 2021-08-25 DIAGNOSIS — R300 Dysuria: Secondary | ICD-10-CM

## 2021-08-25 LAB — POCT URINALYSIS DIP (MANUAL ENTRY)
Bilirubin, UA: NEGATIVE
Glucose, UA: NEGATIVE mg/dL
Ketones, POC UA: NEGATIVE mg/dL
Nitrite, UA: NEGATIVE
Protein Ur, POC: NEGATIVE mg/dL
Spec Grav, UA: <=1.005 — AB (ref 1.010–1.025)
Urobilinogen, UA: 0.2 E.U./dL
pH, UA: 5 (ref 5.0–8.0)

## 2021-08-25 LAB — POCT URINE PREGNANCY: Preg Test, Ur: NEGATIVE

## 2021-08-25 MED ORDER — CEPHALEXIN 500 MG PO CAPS: 500.0000 mg | ORAL_CAPSULE | Freq: Two times a day (BID) | ORAL | 0 refills | Status: AC

## 2021-08-25 NOTE — ED Triage Notes (Addendum)
Patient c/o Dysuria and ABD pain that started today.   Patient endorses "burning when I pee".   Patient denies fever, changes in urine characteristics and vaginal discharge.   History of Reccurent UTI's. Patient has seen a Insurance underwriter.   Patient has taken Tylenol w/ no relief of symptoms.

## 2021-08-25 NOTE — Discharge Instructions (Addendum)
Take the antibiotic as directed.  The urine culture is pending.  We will call you if it shows the need to change or discontinue your antibiotic.    Follow up with your primary care provider if your symptoms are not improving.    

## 2021-08-25 NOTE — ED Provider Notes (Signed)
Allison Torres    CSN: [id number] Arrival date & time: 08/25/21  0830      History   Chief Complaint Chief Complaint  Patient presents with   Abdominal Pain   Dysuria    HPI Allison Torres is a 20 y.o. female.  Patient presents with dysuria and bladder pressure since this morning.  She denies fever, chills, hematuria, vaginal discharge, pelvic pain, flank pain, or other symptoms.  Treatment attempted at home with Tylenol.  She was seen by a urologist last year for frequent UTIs.  Her medical history includes frequent UTIs, asthma, GERD, postural orthostatic tachycardia syndrome POTS, COVID, depression, anxiety, panic attacks, migraine headaches, seizures.  The history is provided by the patient and medical records.   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 No pertinent surgical history.  OB History   No obstetric history on file.      Home Medications    Prior to Admission medications   Medication Sig Start Date End Date Taking? Authorizing Provider  cephALEXin (KEFLEX) 500 MG capsule Take 1 capsule (500 mg total) by mouth 2 (two) times daily for 5 days. 08/25/21 08/30/21 Yes Mickie Bail, NP   esomeprazole (NEXIUM) 20 MG capsule Take 20 mg by mouth daily at 12 noon.   Yes [provider]  JUNEL FE 1/20 1-20 MG-MCG tablet TAKE 1 TABLET BY MOUTH ONCE DAILY FOR 30 DAYS PATIENT NEEDS APPOINTMENT FOR REFILLS 09/02/19  Yes [provider]  nebivolol (BYSTOLIC) 2.5 MG tablet Take 2.5 mg by mouth daily. 06/02/21 06/02/22 Yes [provider]  DULoxetine (CYMBALTA) 60 MG capsule TAKE 2 CAPSULES BY MOUTH DAILY   NEEDS TELEMED APPOINTMENT FOR FURTHER REFILLS 08/17/19   [provider]  omeprazole (PRILOSEC) 40 MG capsule Take by mouth. 11/22/20 11/22/21  [provider]  pantoprazole (PROTONIX) 40 MG tablet Take by mouth. 01/19/20   [provider]  propranolol (INDERAL) 10 MG tablet Take 10 mg by mouth 2 (two) times daily. 05/27/21   [provider]    Family History Family History  Problem Relation Age of Onset   Healthy Father    Healthy Mother     Social History Social History   Tobacco Use   Smoking status: Never   Smokeless tobacco: Never  Vaping Use   Vaping Use: Never used  Substance Use Topics   Alcohol use: Not Currently   Drug use: Never     Allergies   Bupropion and Trintellix [vortioxetine]   Review of Systems Review of Systems  Constitutional:  Negative for chills and fever.  Respiratory:  Negative for cough and shortness of breath.   Cardiovascular:  Negative for chest pain and palpitations.  Gastrointestinal:  Positive for abdominal pain. Negative for diarrhea, nausea and vomiting.  Genitourinary:  Positive for dysuria. Negative for flank pain, hematuria, pelvic pain and vaginal discharge.  Skin:  Negative for color change and rash.  All other systems reviewed and are negative.   Physical Exam Triage Vital Signs ED Triage Vitals  Enc Vitals Group     BP 08/25/21 0849 120/83     Pulse Rate 08/25/21 0849 82     Resp 08/25/21 0849 16     Temp 08/25/21 0849 98.8 F (37.1 C)     Temp Source  08/25/21 0849 Oral     SpO2 08/25/21 0849 97 %     Weight --      Height --      Head Circumference --      Peak Flow --      Pain Score 08/25/21 0858 7     Pain Loc --      Pain Edu? --      Excl. in GC? --    No data found.  Updated Vital Signs BP 120/83 (BP Location: Left Arm)   Pulse 82   Temp 98.8 F (37.1 C) (Oral)   Resp 16   LMP 08/18/2021 (Exact Date)   SpO2 97%   Visual Acuity Right Eye Distance:   Left Eye Distance:   Bilateral Distance:    Right Eye Near:   Left Eye Near:    Bilateral Near:     Physical Exam Vitals and nursing note reviewed.  Constitutional:      General: She is not in acute distress.    Appearance: She is well-developed. She is not ill-appearing.  HENT:     Head: Normocephalic and atraumatic.     Mouth/Throat:     Mouth: Mucous membranes are moist.  Eyes:     Conjunctiva/sclera: Conjunctivae normal.  Cardiovascular:     Rate and Rhythm: Normal rate and regular rhythm.     Heart sounds: Normal heart sounds.  Pulmonary:     Effort: Pulmonary effort is normal. No respiratory distress.     Breath sounds: Normal breath sounds.  Abdominal:     Palpations: Abdomen is soft.     Tenderness: There is no abdominal tenderness. There is no right CVA tenderness, left CVA tenderness, guarding or rebound.  Musculoskeletal:     Cervical back: Neck supple.  Skin:    General: Skin is warm and dry.  Neurological:     General: No focal deficit present.     Mental Status: She is alert and oriented to person, place, and time.  Psychiatric:        Mood and Affect: Mood normal.        Behavior: Behavior normal.     UC Treatments / Results  Labs (all labs ordered are listed, but only abnormal results are displayed) Labs Reviewed  POCT URINALYSIS DIP (MANUAL ENTRY) - Abnormal; Notable for the following components:      Result Value   Spec Grav, UA <=1.005 (*)    Blood, UA small (*)    Leukocytes, UA Trace (*)    All other components within  normal limits  URINE CULTURE  POCT URINE PREGNANCY    EKG   Radiology No results found.  Procedures Procedures (including critical care time)  Medications Ordered in UC Medications - No data to display  Initial Impression / Assessment and Plan / UC Course  I have reviewed the triage vital signs and the nursing notes.  Pertinent labs & imaging results that were available during my care of the patient were reviewed by me and considered in my medical decision making (see chart for details).   Dysuria.  Treating with Keflex. Urine culture pending. Discussed with patient that we will call her if the urine culture shows the need to change or discontinue the antibiotic. Instructed her to follow-up with her PCP if her symptoms are not improving. Patient agrees to plan of care.      Final Clinical Impressions(s) / UC Diagnoses   Final diagnoses:  Dysuria     Discharge Instructions      Take the antibiotic as directed.  The urine culture is pending.  We will call you if it shows the need to change or discontinue your antibiotic.    Follow up with your primary care provider if your symptoms are not improving.        ED Prescriptions     Medication Sig Dispense Auth. Provider   cephALEXin (KEFLEX) 500 MG capsule Take 1 capsule (500 mg total) by mouth 2 (two) times daily for 5 days. 10 capsule Mickie Bail, NP      PDMP not reviewed this encounter.   Mickie Bail, NP 08/25/21 [phone]

## 2021-08-28 LAB — URINE CULTURE: Culture: >=100000 — AB

## 2021-11-07 IMAGING — US US RENAL
1 series · 14 of 25 positions shown · non-contrast
Comparison: None.

CLINICAL DATA: Recurrent UTIs with concern for hydronephrosis or
stones.

EXAM:
RENAL / URINARY TRACT ULTRASOUND COMPLETE

[Series 1: us renal · 0.22mm/px · 14 of 30 slices shown]
[im 1/30]
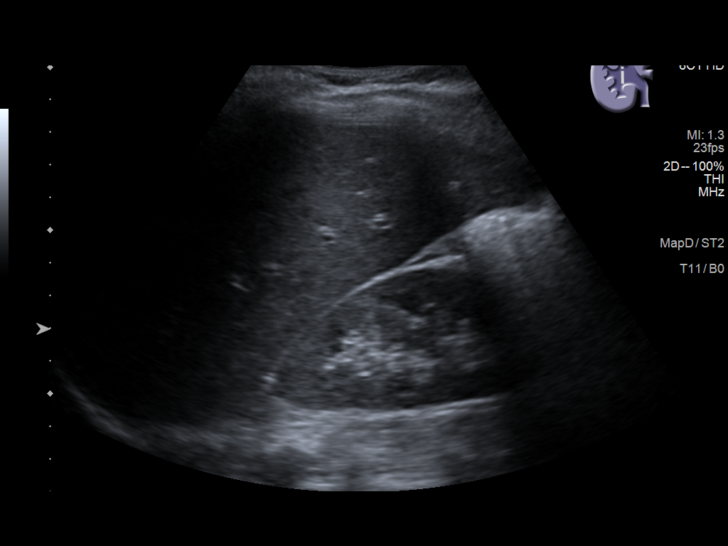
[im 3/30]
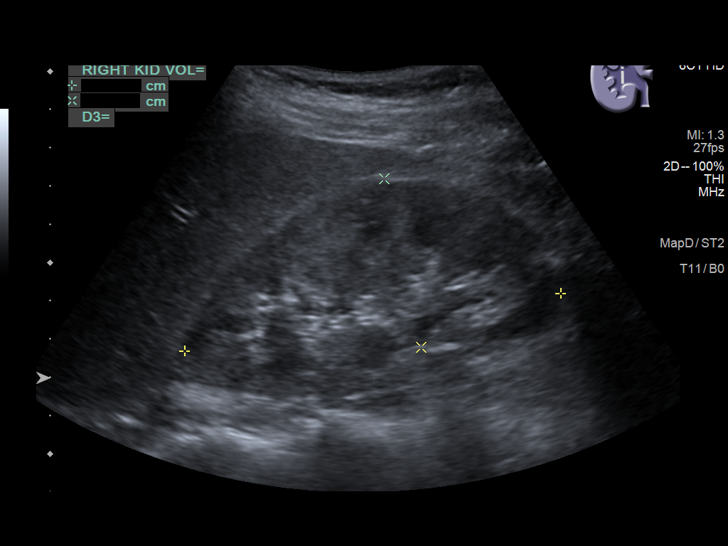
[im 5/30]
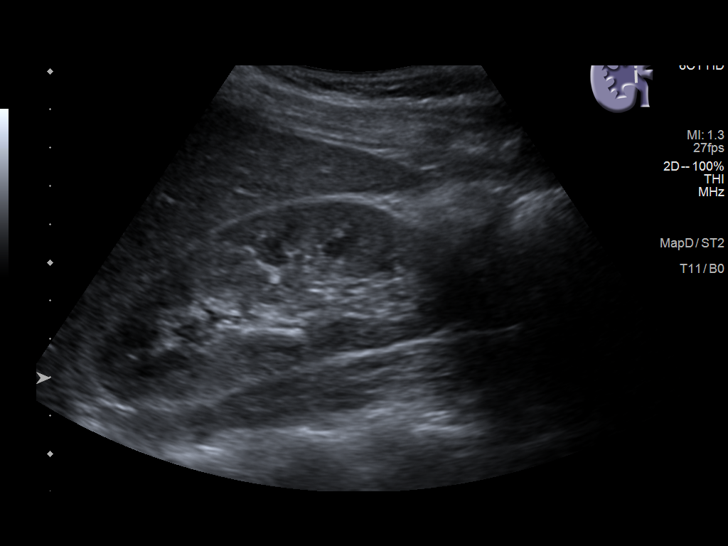
[im 8/30]
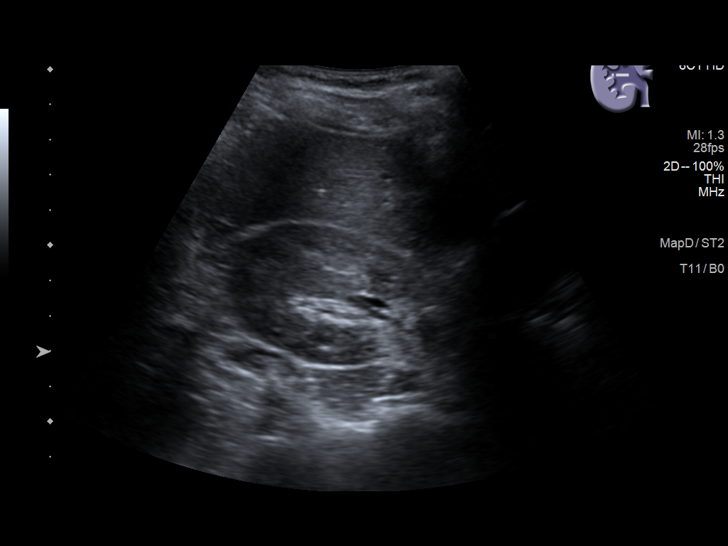
[im 10/30]
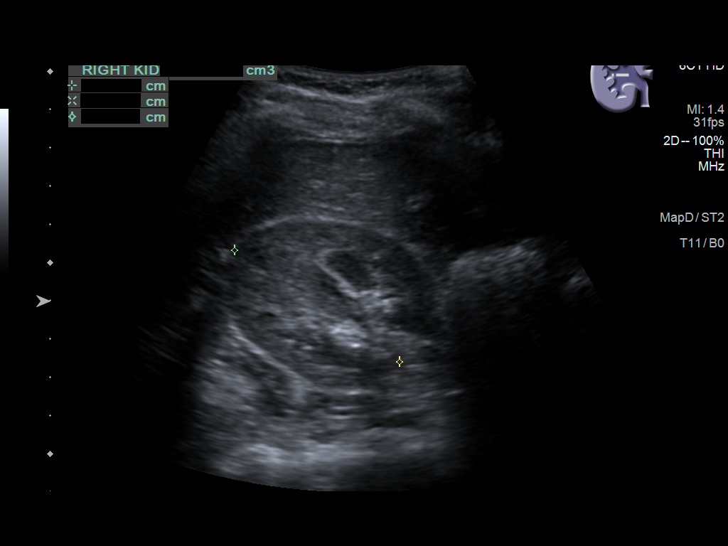
[im 11/30]
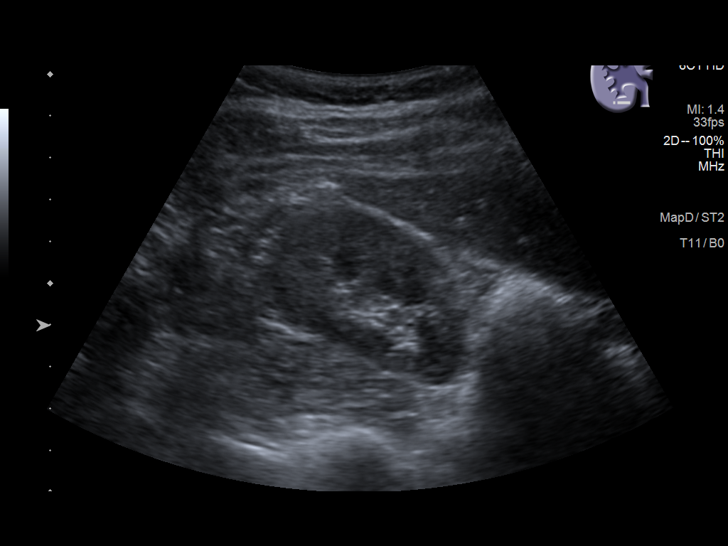
[im 14/30]
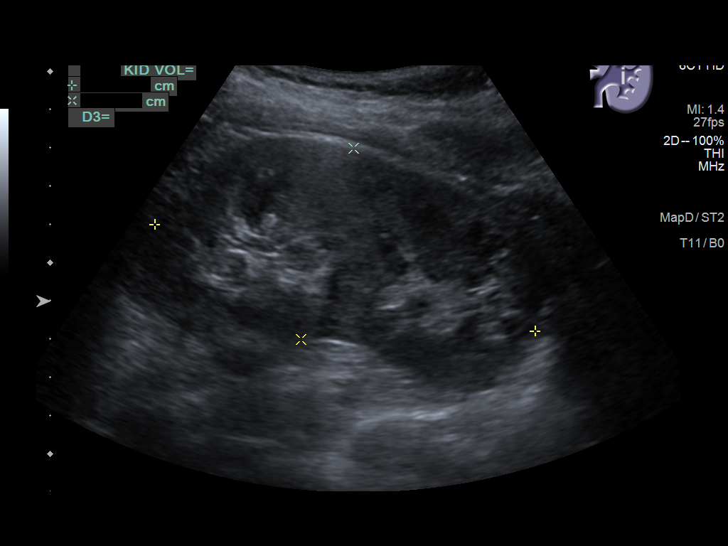
[im 16/30]
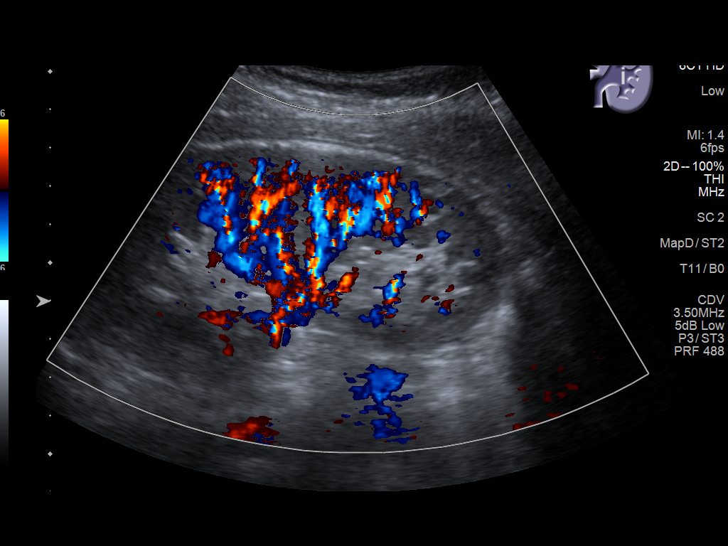
[im 19/30]
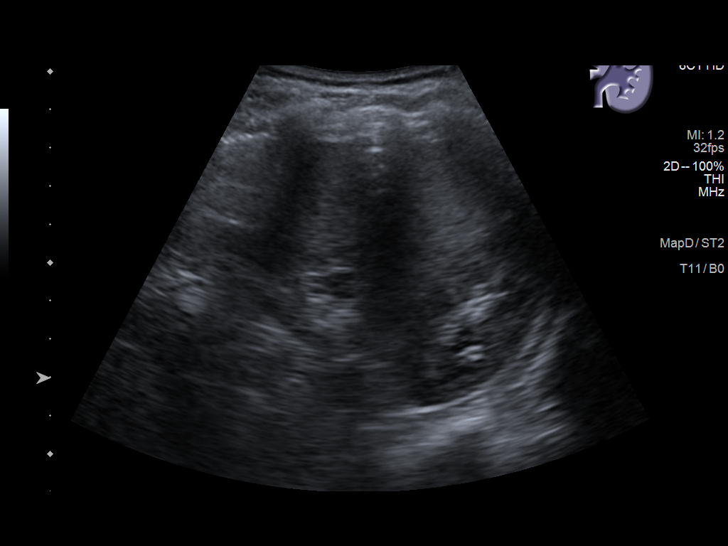
[im 20/30]
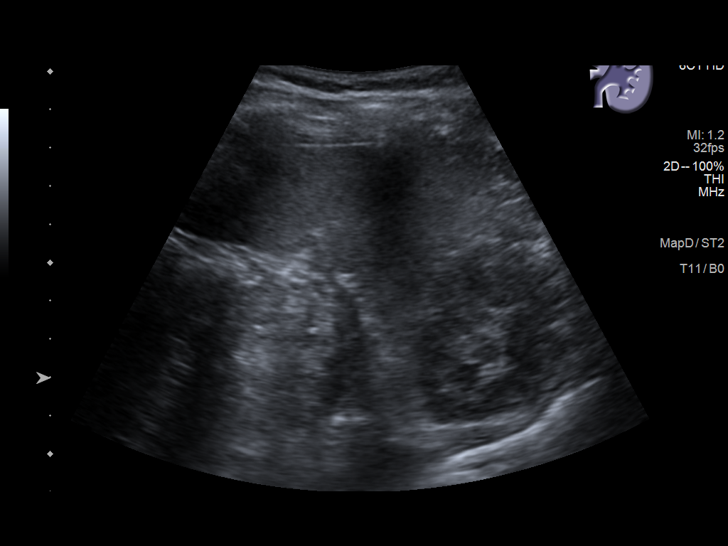
[im 22/30]
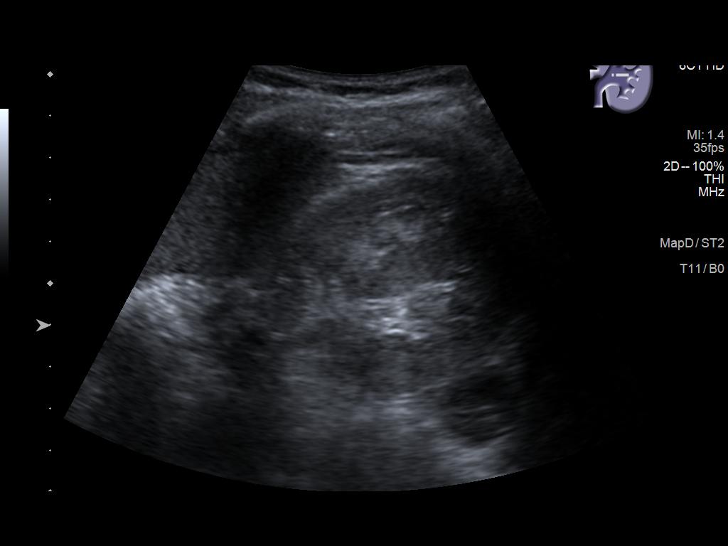
[im 25/30]
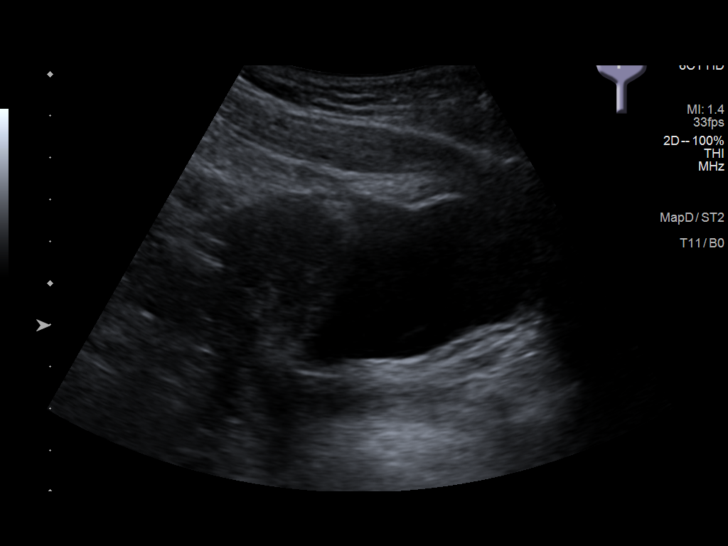
[im 27/30]
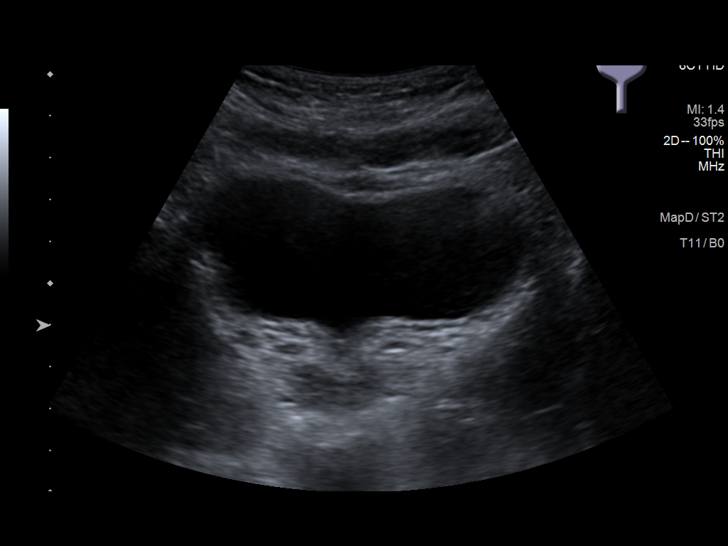
[im 30/30]
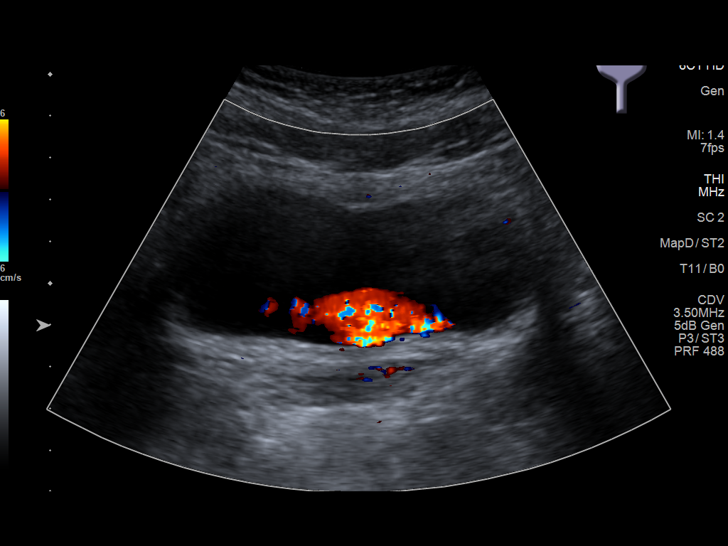

[14 of 25 positions shown; findings below may reference images not displayed]

FINDINGS: Right Kidney:

Renal measurements: 10 x 4.5 x 5.2 cm = volume: 122 mL .
Echogenicity within normal limits. No mass or hydronephrosis
visualized.

Left Kidney:

Renal measurements: 10.3 x 5.2 x 4.6 cm = volume: 128 mL.
Echogenicity within normal limits. No mass or hydronephrosis
visualized.

Bladder:

Appears normal for degree of bladder distention.

Other:

None.
IMPRESSION: Normal study.
